# Patient Record
Sex: Female | Born: 2000 | Race: White | Hispanic: No | Marital: Single | Smoking: Never smoker
Health system: Southern US, Community
[De-identification: ages and names within clinical notes are randomized; demographics above are authoritative.]

## PROBLEM LIST (undated history)

## (undated) DIAGNOSIS — Z8619 Personal history of other infectious and parasitic diseases: Secondary | ICD-10-CM

## (undated) HISTORY — DX: Personal history of other infectious and parasitic diseases: Z86.19

## (undated) HISTORY — PX: RHINOPLASTY: SUR1284

---
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
## 2021-07-01 IMAGING — CR DG HIP (WITH OR WITHOUT PELVIS) 1V*L*
1 series · 2 of 2 positions shown · non-contrast
Comparison: None.

CLINICAL DATA: Anterior left hip pain since [REDACTED].  No known injury.

EXAM:
DG HIP (WITH OR WITHOUT PELVIS) 1V*L*

[Series 1: dg hip unilat w or w/o pelvis 1v left · non-contrast · 0.14mm/px · 2 of 2 slices shown]
[im 1/2]
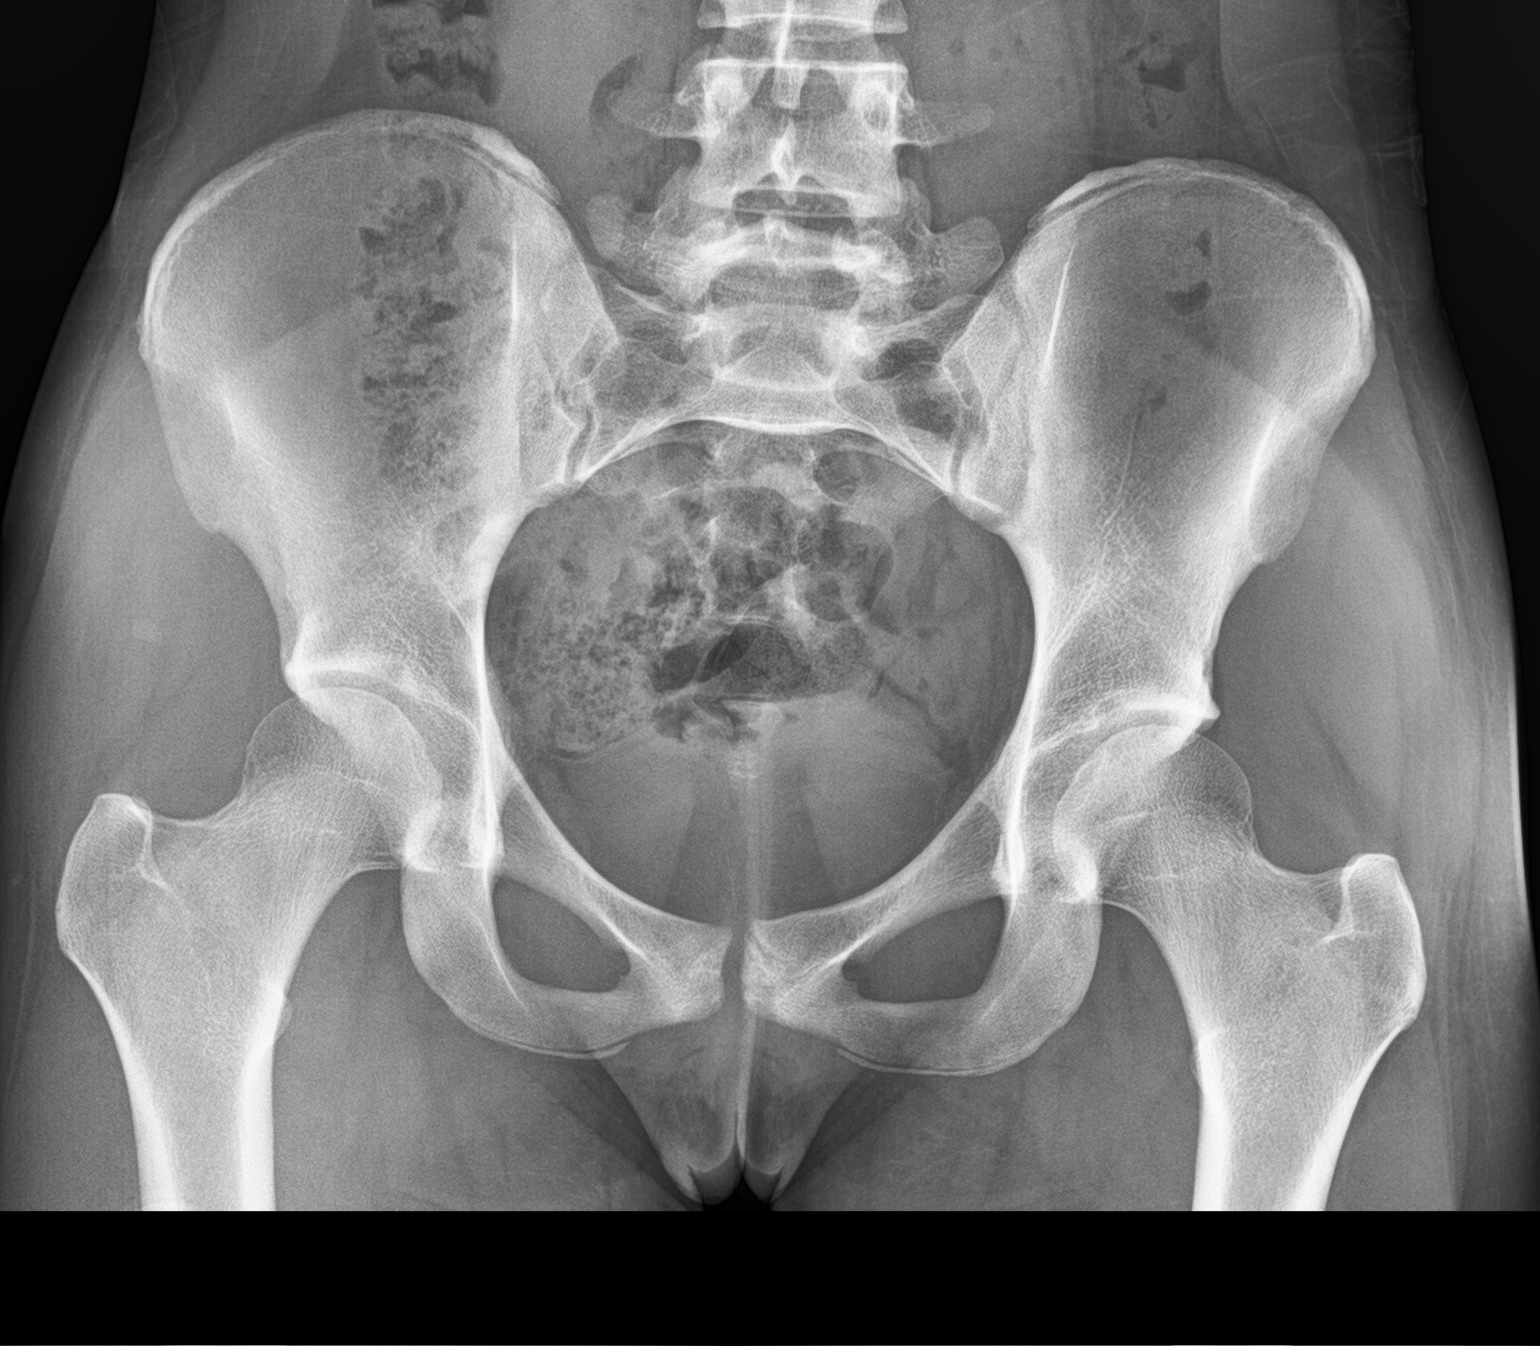
[im 2/2]
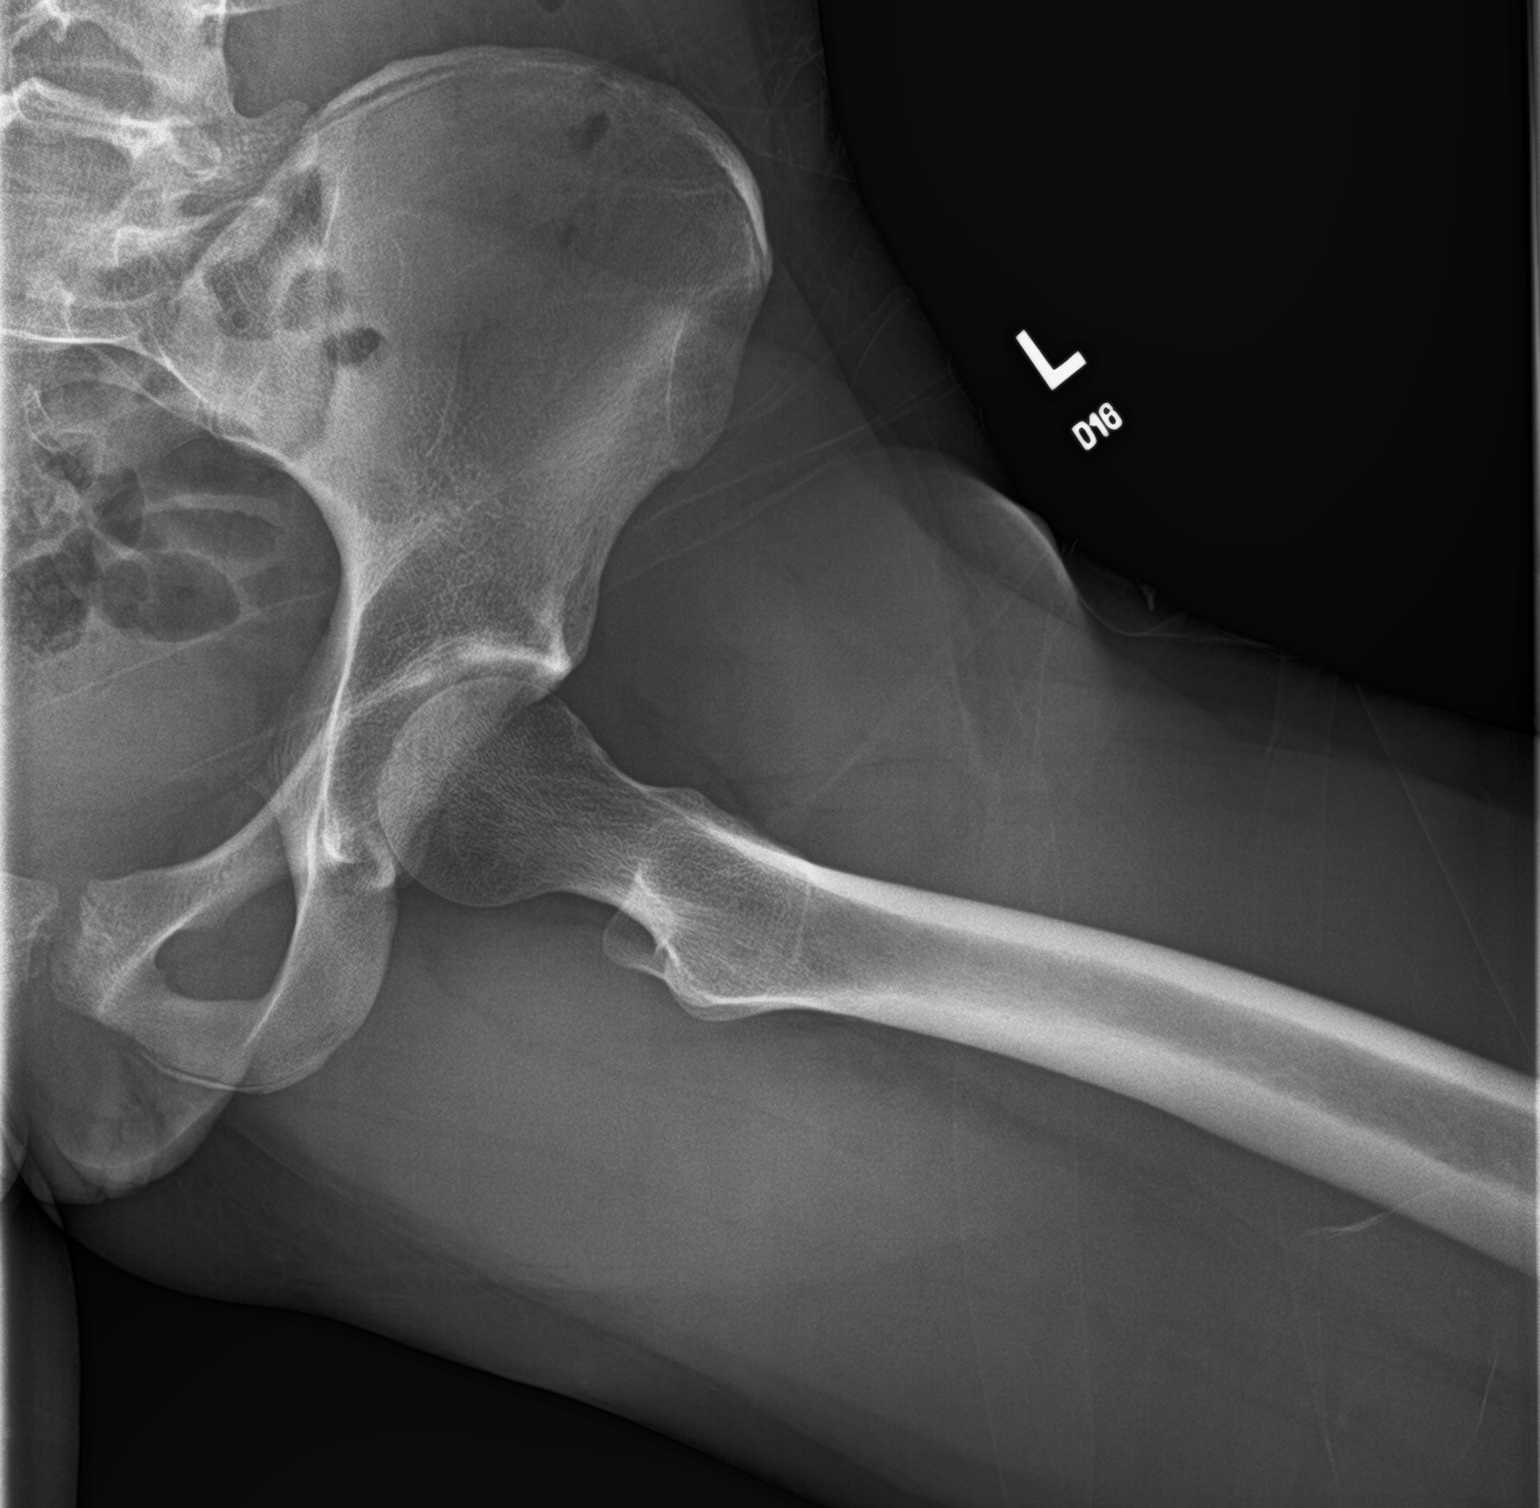

[2 of 2 positions shown; findings below may reference images not displayed]

FINDINGS: There is no evidence of hip fracture or dislocation. There is no
evidence of arthropathy or other focal bone abnormality.
IMPRESSION: Negative.

## 2022-08-13 ENCOUNTER — Ambulatory Visit (INDEPENDENT_AMBULATORY_CARE_PROVIDER_SITE_OTHER): Payer: BC Managed Care – PPO | Admitting: Medical

## 2022-08-13 ENCOUNTER — Encounter: Payer: Self-pay | Admitting: Medical

## 2022-08-13 VITALS — BP 92/62 | HR 87 | Temp 99.3°F | Ht 65.16 in | Wt 127.0 lb

## 2022-08-13 DIAGNOSIS — N760 Acute vaginitis: Secondary | ICD-10-CM

## 2022-08-13 LAB — POCT WET PREP (WET MOUNT)
Clue Cells Wet Prep Whiff POC: POSITIVE — AB
Trichomonas Wet Prep HPF POC: ABSENT

## 2022-08-13 MED ORDER — FLUCONAZOLE 150 MG PO TABS: ORAL_TABLET | ORAL | 0 refills | Status: DC

## 2022-08-13 MED ORDER — METRONIDAZOLE 0.75 % VA GEL: 1.0000 | Freq: Every day | VAGINAL | 0 refills | Status: AC

## 2022-08-13 MED ORDER — METRONIDAZOLE 500 MG PO TABS: 500.0000 mg | ORAL_TABLET | Freq: Two times a day (BID) | ORAL | 0 refills | Status: AC

## 2022-08-13 NOTE — Progress Notes (Signed)
[facility] [address] [place], [place] [address] Phone: [phone] Fax: [phone]   Office Visit Note  Patient Name: Lacey Bauer  Date of Birth:Jan 15, 2001  Med Rec number [id number]  Date of Service: 08/13/2022  Patient has no known allergies.  Chief Complaint  Patient presents with   Urinary Tract Infection    With possible yeast infection     HPI Suspects she may have a UTI or a yeast infection. Has noted vaginal itching and some white discharge over past week. Discharge does have mild odor. No pain with urination, frequency, abd pain, back pain or urgency. No fever/chills. Had UTI sx once before in 11/2021, was tested but did not require antibiotic. No recent antibiotics or steroids. Uses unscented Dove soap. Sometimes doesn't change out of sweaty clothes after exercise.  Denies any prior sexual activity. Takes OCP to regulate menses.     Current Medication:  Outpatient Encounter Medications as of 08/13/2022  Medication Sig   Ascorbic Acid (VITAMIN C) 1000 MG tablet Take 1,000 mg by mouth daily.   fluconazole (DIFLUCAN) 150 MG tablet Take one tablet by mouth. May repeat dose in 5-7 days if symptoms not resolved.   metroNIDAZOLE (METROGEL VAGINAL) 0.75 % vaginal gel Place 1 Applicatorful vaginally at bedtime for 5 days.   Norethindrone-Ethinyl Estradiol-Fe Biphas (LO LOESTRIN FE) 1 MG-10 MCG / 10 MCG tablet Take by mouth.   Probiotic Product (PROBIOTIC PO) Take by mouth.   No facility-administered encounter medications on file as of 08/13/2022.   Results for orders placed or performed in visit on 08/13/22 (from the past 24 hour(s))  POCT Wet Prep [facility] [place])     Status: Abnormal   Collection Time: 08/13/22  4:01 PM  Result Value Ref Range   Source Wet Prep POC Vaginal    WBC, Wet Prep HPF POC Moderate    Bacteria Wet Prep HPF POC Few Few   BACTERIA WET PREP MORPHOLOGY POC     Clue Cells Wet Prep HPF POC Few (A) None   Clue Cells Wet Prep Whiff POC  Positive Whiff (A)    Yeast Wet Prep HPF POC Few (A) None   KOH Wet Prep POC Few (A) None   Trichomonas Wet Prep HPF POC Absent Absent   RBC Wet Prep HPF POC Many       Medical History: History reviewed. No pertinent past medical history.   Vital Signs: BP 92/62   Pulse 87   Temp 99.3 F (37.4 C) (Tympanic)   Ht 5' 5.16" (1.655 m)   Wt 127 lb (57.6 kg)   SpO2 99%   BMI 21.03 kg/m    Review of Systems  Constitutional:  Negative for chills and fever.  Gastrointestinal:  Negative for abdominal pain, nausea and vomiting.  Genitourinary:  Positive for vaginal discharge and vaginal pain (itching). Negative for difficulty urinating, dysuria, flank pain, frequency, hematuria, pelvic pain and urgency.    Physical Exam Constitutional:      General: She is not in acute distress. Genitourinary:    Pubic Area: No rash.      Labia:        Right: No rash, tenderness or lesion.        Left: No rash, tenderness or lesion.      Vagina: Vaginal discharge (white-yellow), erythema (bright red, bilateral) and tenderness (mild) present. No lesions.     Comments: Vaginal mucosa mildly swollen. Neurological:     Mental Status: She is alert.  Assessment/Plan: 1. Vaginitis and vulvovaginitis Some findings suggestive of both yeast and bacterial vaginosis. Will treat for both. Recommended cotton underwear.  Patient advised to change out of sweaty clothing promptly after exercise.  - POCT Wet Prep [facility]) - See results below. - fluconazole (DIFLUCAN) 150 MG tablet; Take one tablet by mouth. May repeat dose in 5-7 days if symptoms not resolved.  Dispense: 2 tablet; Refill: 0 - metroNIDAZOLE (METROGEL VAGINAL) 0.75 % vaginal gel; Place 1 Applicatorful vaginally at bedtime for 5 days.  Dispense: 70 g; Refill: 0     General Counseling: Lacey Bauer verbalizes understanding of the findings of todays visit and agrees with plan of treatment. I have discussed any further diagnostic evaluation that  may be needed or ordered today. We also reviewed her medications today. she has been encouraged to call the office with any questions or concerns that should arise related to todays visit.   Orders Placed This Encounter  Procedures   POCT Wet Prep [facility])    Meds ordered this encounter  Medications   fluconazole (DIFLUCAN) 150 MG tablet    Sig: Take one tablet by mouth. May repeat dose in 5-7 days if symptoms not resolved.    Dispense:  2 tablet    Refill:  0    Order Specific Question:   Supervising Provider    Answer:   Noralee Stain [[id number]]   metroNIDAZOLE (METROGEL VAGINAL) 0.75 % vaginal gel    Sig: Place 1 Applicatorful vaginally at bedtime for 5 days.    Dispense:  70 g    Refill:  0    Order Specific Question:   Supervising Provider    Answer:   Noralee Stain [[id number]]    Time spent:25 Minutes    Jonathon Resides PA-C [facility] 08/13/2022 5:27 PM

## 2022-08-13 NOTE — Patient Instructions (Signed)
Send my chart message to provider or schedule return visit as needed for new/worsening symptoms or if symptoms do not improve as discussed with recommended treatment.

## 2022-08-26 ENCOUNTER — Ambulatory Visit (INDEPENDENT_AMBULATORY_CARE_PROVIDER_SITE_OTHER): Payer: BC Managed Care – PPO | Admitting: Medical

## 2022-08-26 ENCOUNTER — Other Ambulatory Visit: Payer: Self-pay

## 2022-08-26 ENCOUNTER — Encounter: Payer: Self-pay | Admitting: Medical

## 2022-08-26 VITALS — BP 98/62 | HR 108 | Temp 100.7°F | Ht 65.16 in | Wt 125.0 lb

## 2022-08-26 DIAGNOSIS — J029 Acute pharyngitis, unspecified: Secondary | ICD-10-CM | POA: Diagnosis not present

## 2022-08-26 DIAGNOSIS — R509 Fever, unspecified: Secondary | ICD-10-CM | POA: Diagnosis not present

## 2022-08-26 LAB — POCT RAPID STREP A (OFFICE): Rapid Strep A Screen: NEGATIVE

## 2022-08-26 NOTE — Progress Notes (Signed)
[facility] [address] [place], [place] [address] Phone: [phone] Fax: [phone]   Office Visit Note  Patient Name: Lacey Bauer  Date of Birth:02-24-2001  Med Rec number [id number]  Date of Service: 08/26/2022  Patient has no known allergies.  Chief Complaint  Patient presents with   sick     HPI Began to feel poorly upon return to campus 2-3 weeks ago, felt like she was dehydrated (had some HA, dry throat). Felt better after couple days. Yesterday, began to feel unwell later in afternoon. Had HA, felt lightheaded, dry throat, some painful to swallow. This AM, feels like head is congested, nose not congested, no runny nose or cough. Slight nausea last night. No vomiting or diarrhea. Neck little achy. Body sore, unsure if due to track practices. Also no appetite last couple days.   Denies sick contacts. Had mono in high school.  Has taken Advil, last dose this AM.    Current Medication:  Outpatient Encounter Medications as of 08/26/2022  Medication Sig   Ascorbic Acid (VITAMIN C) 1000 MG tablet Take 1,000 mg by mouth daily.   Norethindrone-Ethinyl Estradiol-Fe Biphas (LO LOESTRIN FE) 1 MG-10 MCG / 10 MCG tablet Take by mouth.   Probiotic Product (PROBIOTIC PO) Take by mouth.   [DISCONTINUED] fluconazole (DIFLUCAN) 150 MG tablet Take one tablet by mouth. May repeat dose in 5-7 days if symptoms not resolved. (Patient not taking: Reported on 08/26/2022)   No facility-administered encounter medications on file as of 08/26/2022.      Medical History: Past hx of mononucleosis   Vital Signs: BP 98/62   Pulse (!) 108   Temp (!) 100.7 F (38.2 C) (Tympanic)   Ht 5' 5.16" (1.655 m)   Wt 125 lb (56.7 kg)   SpO2 98%   BMI 20.70 kg/m    Review of Systems  Constitutional:  Positive for appetite change (decreased), chills, fatigue and fever.  HENT:  Positive for congestion (head) and sore throat. Negative for rhinorrhea.   Respiratory:  Negative for cough.    Musculoskeletal:  Positive for myalgias.  Neurological:  Positive for light-headedness and headaches.    Physical Exam Constitutional:      General: She is not in acute distress.    Appearance: She is not ill-appearing.     Comments: Tired appearing  HENT:     Head: Normocephalic.     Right Ear: Ear canal and external ear normal.     Left Ear: Ear canal and external ear normal.     Ears:     Comments: Tms dull bilat, small-moderate serous middle ear fluid    Nose: Nasal tenderness present. No mucosal edema or congestion.     Mouth/Throat:     Mouth: Mucous membranes are moist. No oral lesions.     Pharynx: Pharyngeal swelling (mild), oropharyngeal exudate (small yellow-green to right posterior pharynx) and posterior oropharyngeal erythema present. No uvula swelling.     Tonsils: No tonsillar exudate. 1+ on the right. 1+ on the left.     Comments: Some petechiae to palate. No significant erythema to tonsils. Cardiovascular:     Rate and Rhythm: Regular rhythm. Tachycardia present.     Heart sounds: No murmur heard.    No friction rub. No gallop.  Pulmonary:     Effort: Pulmonary effort is normal.     Breath sounds: Normal breath sounds. No wheezing, rhonchi or rales.  Musculoskeletal:     Cervical back: Neck supple. No rigidity.  Lymphadenopathy:     Cervical: Cervical adenopathy (1+ anterior bilat, mildly tender. Small posterior cervical nodes, sl tender.) present.  Neurological:     Mental Status: She is alert.      Assessment/Plan: 1. Pharyngitis, unspecified etiology 2. Fever, unspecified fever cause -POCT COVID-19 antigen test done, Result: NEGATIVE; Patient not charged for test because test provided by [facility]. - POCT rapid strep A; Result: NEGATIVE - CBC with Differential/Platelet  Will check CBC w/diff to better characterize infection. Patient prefers [facility] [phone] [address] [place]) if antibiotic is needed.  -Take an over-the-counter pain  reliever/anti-inflammatory (such as ibuprofen) to help relieve pain or fever.   -Rest and drink plenty of water.  Drinking warm or cold liquids (such as tea, soup or smoothies) and eating soft foods (such as oatmeal) may be more comfortable until your throat pain improves.   -Do not share cups/water bottles/ utensils with others.  Wash your hands or use hand sanitizer often, especially before/after eating and after coughing into your hand or blowing your nose.    -You will receive a MyChart message notifying you of your lab results when they are available. -Send MyChart message to provider in meantime as needed for new/worsening symptoms (such as increased throat pain).   General Counseling: Noya verbalizes understanding of the findings of todays visit and agrees with plan of treatment. I have discussed any further diagnostic evaluation that may be needed or ordered today. We also reviewed her medications today. she has been encouraged to call the office with any questions or concerns that should arise related to todays visit.   No orders of the defined types were placed in this encounter.   No orders of the defined types were placed in this encounter.   Time spent: 25 Minutes    Derrich Gaby [employer] [facility] 08/26/2022 2:03 PM

## 2022-08-27 ENCOUNTER — Other Ambulatory Visit: Payer: Self-pay | Admitting: Medical

## 2022-08-27 DIAGNOSIS — J028 Acute pharyngitis due to other specified organisms: Secondary | ICD-10-CM

## 2022-08-27 LAB — CBC WITH DIFFERENTIAL/PLATELET
Basophils Absolute: 0 10*3/uL (ref 0.0–0.2)
Basos: 0 %
EOS (ABSOLUTE): 0 10*3/uL (ref 0.0–0.4)
Eos: 0 %
Hematocrit: 41.9 % (ref 34.0–46.6)
Hemoglobin: 14.3 g/dL (ref 11.1–15.9)
Immature Grans (Abs): 0.1 10*3/uL (ref 0.0–0.1)
Immature Granulocytes: 1 %
Lymphocytes Absolute: 1.6 10*3/uL (ref 0.7–3.1)
Lymphs: 11 %
MCH: 32.1 pg (ref 26.6–33.0)
MCHC: 34.1 g/dL (ref 31.5–35.7)
MCV: 94 fL (ref 79–97)
Monocytes Absolute: 1.4 10*3/uL — ABNORMAL HIGH (ref 0.1–0.9)
Monocytes: 10 %
Neutrophils Absolute: 11.9 10*3/uL — ABNORMAL HIGH (ref 1.4–7.0)
Neutrophils: 78 %
Platelets: 235 10*3/uL (ref 150–450)
RBC: 4.45 x10E6/uL (ref 3.77–5.28)
RDW: 11.7 % (ref 11.7–15.4)
WBC: 15 10*3/uL — ABNORMAL HIGH (ref 3.4–10.8)

## 2022-08-27 MED ORDER — AMOXICILLIN 875 MG PO TABS: 875.0000 mg | ORAL_TABLET | Freq: Two times a day (BID) | ORAL | 0 refills | Status: AC

## 2022-08-27 NOTE — Progress Notes (Signed)
CBC done 08/26/22 with left shift suggestive of bacterial infection. Antibiotic sent to pharmacy, will notify patient.

## 2022-08-29 ENCOUNTER — Encounter: Payer: Self-pay | Admitting: Medical

## 2022-08-29 NOTE — Patient Instructions (Signed)
-  Take an over-the-counter pain reliever/anti-inflammatory (such as ibuprofen) to help relieve pain or fever.   -Rest and drink plenty of water.  Drinking warm or cold liquids (such as tea, soup or smoothies) and eating soft foods (such as oatmeal) may be more comfortable until your throat pain improves.   -Do not share cups/water bottles/ utensils with others.  Wash your hands or use hand sanitizer often, especially before/after eating and after coughing into your hand or blowing your nose.    -You will receive a MyChart message notifying you of your lab results when they are available. -Send MyChart message to provider in meantime as needed for new/worsening symptoms (such as increased throat pain).

## 2023-01-27 ENCOUNTER — Encounter: Payer: Self-pay | Admitting: Medical

## 2023-01-27 ENCOUNTER — Ambulatory Visit (INDEPENDENT_AMBULATORY_CARE_PROVIDER_SITE_OTHER): Payer: BC Managed Care – PPO | Admitting: Medical

## 2023-01-27 ENCOUNTER — Other Ambulatory Visit: Payer: Self-pay

## 2023-01-27 VITALS — BP 99/61 | HR 79 | Temp 99.1°F | Ht 65.16 in | Wt 134.0 lb

## 2023-01-27 DIAGNOSIS — N760 Acute vaginitis: Secondary | ICD-10-CM | POA: Diagnosis not present

## 2023-01-27 LAB — POCT URINALYSIS DIPSTICK (MANUAL)
Leukocytes, UA: NEGATIVE
Nitrite, UA: NEGATIVE
Poct Bilirubin: NEGATIVE
Poct Blood: 50 — AB
Poct Glucose: NORMAL mg/dL
Poct Ketones: NEGATIVE
Poct Urobilinogen: NORMAL mg/dL
Spec Grav, UA: 1.02 (ref 1.010–1.025)
pH, UA: 7 (ref 5.0–8.0)

## 2023-01-27 LAB — POCT WET PREP (WET MOUNT)
Clue Cells Wet Prep Whiff POC: NEGATIVE
Trichomonas Wet Prep HPF POC: ABSENT

## 2023-01-27 MED ORDER — FLUCONAZOLE 150 MG PO TABS: 150.0000 mg | ORAL_TABLET | Freq: Once | ORAL | 0 refills | Status: AC

## 2023-01-27 NOTE — Progress Notes (Signed)
[address] [name], [name] [address] Phone: [phone] Fax: [phone]   Office Visit Note  Patient Name: Lacey Bauer  Date of [id number]  Med Rec number [id number]  Date of Service: 01/27/2023  Allergies: Patient has no known allergies.  Chief Complaint  Patient presents with   Urinary Tract Infection     HPI 22 YO female presents for possible vaginal infection/UTI.  Worried she may have a vaginal infection. Noted some white vaginal discharge about a month ago, concerned about yeast. Was in same clothes all day after track workout last week. Next day, noted vaginal discomfort, not burning or itching. No discharge she has noticed recently. No unusual frequency or urgency. Not on menses currently, on OCP.   Treated for BV and yeast in Aug. Has not been sexually active recently. Thinks last had STI testing about 18 months ago at GYN. Was sexually active with BF after this, not consistently with condoms. Not concerned for STI.    Current Medication:  Outpatient Encounter Medications as of 01/27/2023  Medication Sig   Ascorbic Acid (VITAMIN C) 1000 MG tablet Take 1,000 mg by mouth daily.   Norethindrone-Ethinyl Estradiol-Fe Biphas (LO LOESTRIN FE) 1 MG-10 MCG / 10 MCG tablet Take by mouth.   Probiotic Product (PROBIOTIC PO) Take by mouth.   No facility-administered encounter medications on file as of 01/27/2023.      Medical History: Past Medical History:  Diagnosis Date   History of mononucleosis      Vital Signs: BP 99/61   Pulse 79   Temp 99.1 F (37.3 C) (Tympanic)   Ht 5' 5.16" (1.655 m)   Wt 134 lb (60.8 kg)   SpO2 99%   BMI 22.19 kg/m    Review of Systems  Constitutional:  Negative for chills and fever.  Genitourinary:  Positive for vaginal discharge and vaginal pain ("discomfort", not itching). Negative for dysuria, flank pain, frequency, genital sores, pelvic pain and urgency.    Physical Exam Vitals reviewed.   Constitutional:      General: She is not in acute distress.    Appearance: She is not ill-appearing.  Genitourinary:    Pubic Area: No rash.      Labia:        Right: No rash, tenderness or lesion.        Left: No rash, tenderness or lesion.      Vagina: Vaginal discharge (small dry white), erythema (beefy red) and tenderness (slightly) present. No bleeding or lesions.  Neurological:     Mental Status: She is alert.    Recent Results (from the past 2160 hour(s))  POCT Urinalysis Dip Manual     Status: Abnormal   Collection Time: 01/27/23 11:05 AM  Result Value Ref Range   Spec Grav, UA 1.020 1.010 - 1.025   pH, UA 7.0 5.0 - 8.0   Leukocytes, UA Negative Negative   Nitrite, UA Negative Negative   Poct Protein trace Negative, trace mg/dL   Poct Glucose Normal Normal mg/dL   Poct Ketones Negative Negative   Poct Urobilinogen Normal Normal mg/dL   Poct Bilirubin Negative Negative   Poct Blood =50 (A) Negative, trace  POCT Wet Prep [name])     Status: Abnormal   Collection Time: 01/27/23 11:14 AM  Result Value Ref Range   Source Wet Prep POC vaginal    WBC, Wet Prep HPF POC few    Bacteria Wet Prep HPF POC Few Few   BACTERIA WET  PREP MORPHOLOGY POC     Clue Cells Wet Prep HPF POC None None   Clue Cells Wet Prep Whiff POC Negative Whiff    Yeast Wet Prep HPF POC Many (A) None   KOH Wet Prep POC Few (A) None   Trichomonas Wet Prep HPF POC Absent Absent   RBC Wet Prep HPF POC Many     Assessment/Plan: 1. Acute vaginitis Exam and microscopy suggestive of fungal infection. Urinalysis, history and symptoms not suggestive of UTI. Will treat with Fluconazole. Discussed importance of changing out of sweaty clothing, especially tight clothing promptly. Encouraged cotton underwear, unscented soap. Patient encouraged to contact provider or schedule follow up visit if symptoms do not resolve with prescribed treatment.  - POCT Urinalysis Dip Manual - POCT Wet Prep ([place] [place]) -  fluconazole (DIFLUCAN) 150 MG tablet; Take 1 tablet (150 mg total) by mouth once for 1 dose. Take second dose in 5 days.  Dispense: 2 tablet; Refill: 0    General Counseling: [name] verbalizes understanding of the findings of todays visit and agrees with plan of treatment. she has been encouraged to call the office with any questions or concerns that should arise related to todays visit.   Orders Placed This Encounter  Procedures   POCT Urinalysis Dip Manual    No orders of the defined types were placed in this encounter.   Time spent:20 [place] PA-C [place] 01/27/2023 10:43 AM

## 2023-01-30 ENCOUNTER — Encounter: Payer: Self-pay | Admitting: Medical

## 2023-01-30 NOTE — Patient Instructions (Signed)
-  Take one dose of Fluconazole today and second dose in 5-7 days. -Wear cotton underwear and use unscented soap (i.e. unscented Dove bar soap). -Change out of sweaty clothing promptly. Avoid wearing tight shorts/pants any longer than necessary. -Send MyChart message to provider or schedule return visit as needed for new/worsening symptoms or if symptoms do not resolve with recommended treatment.

## 2023-02-07 DIAGNOSIS — B3731 Acute candidiasis of vulva and vagina: Secondary | ICD-10-CM

## 2023-02-07 MED ORDER — FLUCONAZOLE 150 MG PO TABS: 150.0000 mg | ORAL_TABLET | Freq: Once | ORAL | 0 refills | Status: AC

## 2023-09-28 ENCOUNTER — Encounter: Payer: Self-pay | Admitting: Medical

## 2023-09-28 ENCOUNTER — Ambulatory Visit (INDEPENDENT_AMBULATORY_CARE_PROVIDER_SITE_OTHER): Payer: BC Managed Care – PPO | Admitting: Medical

## 2023-09-28 ENCOUNTER — Other Ambulatory Visit: Payer: Self-pay

## 2023-09-28 VITALS — BP 101/68 | HR 72 | Temp 98.9°F | Ht 65.75 in | Wt 130.0 lb

## 2023-09-28 DIAGNOSIS — N760 Acute vaginitis: Secondary | ICD-10-CM

## 2023-09-28 MED ORDER — FLUCONAZOLE 150 MG PO TABS: 150.0000 mg | ORAL_TABLET | ORAL | 0 refills | Status: AC

## 2023-09-28 NOTE — Patient Instructions (Signed)
Take Fluconazole doses as discussed every 3 to 5 days. May use over-the-counter miconazole cream externally for itching as needed. Continue taking probiotic daily.

## 2023-09-28 NOTE — Progress Notes (Signed)
[facility] [address] [place], [place] [address] Phone: [phone] Fax: [phone]   Office Visit Note  Patient Name: Lacey Bauer  Date of Birth:03-11-2001  Med Rec number [id number]  Date of Service: 09/28/2023  Allergies: Patient has no known allergies.  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  Began to note vulvar itching last week. Also noting vaginal discharge, white/thick. Mild odor after working out. No genital lesions or sores. No at home tx so far. No sexual activity in over a year. Thinks last STI testing was 1-2 years ago, not interested in testing today.   Has GYN appt in Nov for pap smear and discussion of yeast infections.  Has had some recurring yeast infections, most recent was in Feb. Treated successfully with a few doses of Fluconazole. 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 No recent abx or steroids.  Took AZO probiotic for awhile in spring, resumed taking few days ago.   Current Medication:  Outpatient Encounter Medications as of 09/28/2023  Medication Sig   Norethindrone-Ethinyl Estradiol-Fe Biphas (LO LOESTRIN FE) 1 MG-10 MCG / 10 MCG tablet Take by mouth.   Probiotic Product (PROBIOTIC PO) Take by mouth.   [DISCONTINUED] Ascorbic Acid (VITAMIN C) 1000 MG tablet Take 1,000 mg by mouth daily.   No facility-administered encounter medications on file as of 09/28/2023.      Medical History: Past Medical History:  Diagnosis Date   History of mononucleosis      Vital Signs: BP 101/68   Pulse 72   Temp 98.9 F (37.2 C) (Tympanic)   Ht 5' 5.75" (1.67 m)   Wt 130 lb (59 kg)   SpO2 99%   BMI 21.14 kg/m    Review of Systems  Constitutional: Negative.   Genitourinary:  Positive for vaginal discharge. Negative for dysuria, genital sores, pelvic pain and vaginal pain.       +Vulvar and vaginal pruritus    Physical Exam Vitals reviewed.   Constitutional:      General: She is not in acute distress.    Appearance: She is not ill-appearing.  Neurological:     Mental Status: She is alert.     Assessment/Plan: 1. Vaginitis and vulvovaginitis Offered exam and microscopy to confirm suspected dx of yeast vulvovaginitis.  Also offered option of empiric treatment based on symptoms.  Patient elected for latter option.  Recommended continuing to take probiotic supplement daily.  Reviewed hygiene measures (i.e. wearing cotton underwear, avoid tight fitting clothing, unscented soap).  Recommended discussing recurring yeast infections with GYN at upcoming appointment, may need to consider yeast culture.   - fluconazole (DIFLUCAN) 150 MG tablet; Take 1 tablet (150 mg total) by mouth every 3 (three) days for 3 doses.  Dispense: 3 tablet; Refill: 0     General Counseling: Lacey Bauer verbalizes understanding of the findings of todays visit and agrees with plan of treatment. she has been encouraged to call the office with any questions or concerns that should arise related to todays visit.    Time spent:15 Minutes    Jonathon Resides PA-C [employer] [facility] 09/28/2023 1:00 PM

## 2024-04-19 ENCOUNTER — Ambulatory Visit (INDEPENDENT_AMBULATORY_CARE_PROVIDER_SITE_OTHER): Admitting: Medical

## 2024-04-19 ENCOUNTER — Encounter: Payer: Self-pay | Admitting: Medical

## 2024-04-19 VITALS — HR 106 | Temp 100.5°F | Ht 66.0 in | Wt 124.0 lb

## 2024-04-19 DIAGNOSIS — R509 Fever, unspecified: Secondary | ICD-10-CM | POA: Diagnosis not present

## 2024-04-19 DIAGNOSIS — J039 Acute tonsillitis, unspecified: Secondary | ICD-10-CM

## 2024-04-19 MED ORDER — AMOXICILLIN-POT CLAVULANATE 875-125 MG PO TABS: 1.0000 | ORAL_TABLET | Freq: Two times a day (BID) | ORAL | 0 refills | Status: AC

## 2024-04-19 NOTE — Patient Instructions (Signed)
 -  Finish all antibiotics as prescribed, even when you are feeling better. Take antibiotic with food.   -Take an over-the-counter pain reliever/anti-inflammatory (such as ibuprofen) to help relieve pain or fever.   -Rest and drink plenty of water.  Drinking warm or cold liquids (such as tea, soup or smoothies) and eating soft foods (such as oatmeal) may be more comfortable until your throat pain improves.   -Do not share cups/water bottles/ utensils with others.  Wash your hands or use hand sanitizer often, especially before/after eating and after coughing into your hand or blowing your nose.   -Send MyChart message to provider or schedule return appointment as needed for new/worsening symptoms (such as increased throat pain or difficulty swallowing) or if your symptoms are not improving after 2-3 days on the antibiotic.

## 2024-04-19 NOTE — Progress Notes (Signed)
 [facility] [address] [place], [place] [address] Phone: [phone] Fax: [phone]   Office Visit Note  Patient Name: Lacey Bauer  Date of Birth:12-17-2001  Med Rec number [id number]  Date of Service: 04/19/2024  Allergies: Patient has no known allergies.  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  Noted sore throat and swollen tonsils beginning 2 days ago. Has been painful to swallow. Has felt subjectively feverish - has had chills and sweats. Has had a HA. No nasal congestion, runny nose or cough. Some myalgias. No nausea, vomiting, diarrhea.   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 Pt has had mono before. Denies sharing drinks.   Taking 800 mg Ibuprofen every 6 hours, helps with HA/myalgias but not sore throat as much.    Current Medication:  Outpatient Encounter Medications as of 04/19/2024  Medication Sig   Norethindrone-Ethinyl Estradiol-Fe Biphas (LO LOESTRIN FE) 1 MG-10 MCG / 10 MCG tablet Take by mouth.   Probiotic Product (PROBIOTIC PO) Take by mouth.   No facility-administered encounter medications on file as of 04/19/2024.      Medical History: Past Medical History:  Diagnosis Date   History of mononucleosis      Vital Signs: Pulse (!) 106   Temp (!) 100.5 F (38.1 C) (Tympanic)   Ht 5\' 6"  (1.676 m)   Wt 124 lb (56.2 kg)   SpO2 98%   BMI 20.01 kg/m    Review of Systems See HPI  Physical Exam Vitals reviewed.  Constitutional:      General: She is not in acute distress.    Comments: Mildly unwell appearing  HENT:     Head: Normocephalic.     Right Ear: Ear canal and external ear normal. Tympanic membrane is not erythematous or bulging.     Left Ear: Ear canal and external ear normal. Tympanic membrane is not erythematous or bulging.     Ears:     Comments: TMs dull bilaterally. Small serous middle ear fluid bilaterally.     Nose: Mucosal edema (mild) and rhinorrhea present. No congestion. Rhinorrhea is clear.     Mouth/Throat:     Mouth: Mucous membranes are moist. No oral lesions.     Pharynx: Uvula midline. No pharyngeal swelling, posterior oropharyngeal erythema or uvula swelling.     Tonsils: Tonsillar exudate (moderate patches of white) present. No tonsillar abscesses. 2+ on the right. 2+ on the left.     Comments: Tonsils with moderate erythema Cardiovascular:     Rate and Rhythm: Regular rhythm. Tachycardia present.     Heart sounds: No murmur heard.    No friction rub. No gallop.  Pulmonary:     Effort: Pulmonary effort is normal.     Breath sounds: Normal breath sounds. No wheezing, rhonchi or rales.  Musculoskeletal:     Cervical back: Neck supple. No rigidity.  Lymphadenopathy:     Cervical: Cervical adenopathy (1-2+ anterior nodes, tender) present.  Neurological:     Mental Status: She is alert.     Assessment/Plan: 1. Tonsillitis with exudate (Primary) 2. Fever, unspecified fever cause  - amoxicillin -clavulanate (AUGMENTIN) 875-125 MG tablet; Take 1 tablet by mouth 2 (two) times daily for 10 days.  Dispense: 20 tablet; Refill: 0  History and exam finding clinically suspicious for bacterial infection. Centor criteria met. Patient prefers to avoid throat swab, does not tolerate well.  Will treat based on clinical findings for bacterial infection.  Patient Instructions  -Waynetta Hair all antibiotics as prescribed, even when you are feeling better. Take antibiotic with food.   -Take an over-the-counter pain reliever/anti-inflammatory (such as ibuprofen) to help relieve pain or fever.   -Rest and drink plenty of water.  Drinking warm or cold liquids (such as tea, soup or smoothies) and eating soft foods (such as oatmeal) may be more comfortable until your throat pain improves.   -Do not share cups/water bottles/ utensils with others.  Wash your hands or use hand sanitizer often, especially  before/after eating and after coughing into your hand or blowing your nose.   -Send MyChart message to provider or schedule return appointment as needed for new/worsening symptoms (such as increased throat pain or difficulty swallowing) or if your symptoms are not improving after 2-3 days on the antibiotic.         General Counseling: Forrestine verbalizes understanding of the findings of todays visit and plan of treatment. she has been encouraged to call the office with any questions or concerns that should arise related to todays visit.    Time spent:20 Minutes    Ponciano Bristle PA-C [facility] 04/19/2024 10:27 AM

## 2024-04-20 ENCOUNTER — Ambulatory Visit: Admitting: Medical
